# Patient Record
Sex: Male | Born: 1980 | Race: Black or African American | Hispanic: No | Marital: Single | Smoking: Never smoker
Health system: Southern US, Community
[De-identification: ages and names within clinical notes are randomized; demographics above are authoritative.]

---

## 2019-03-13 ENCOUNTER — Other Ambulatory Visit: Payer: Self-pay

## 2019-03-13 ENCOUNTER — Emergency Department (HOSPITAL_COMMUNITY)
Admission: EM | Admit: 2019-03-13 | Discharge: 2019-03-13 | Disposition: A | Discharge status: Home / Self Care | Payer: Self-pay | Attending: Emergency Medicine | Admitting: Emergency Medicine

## 2019-03-13 ENCOUNTER — Encounter (HOSPITAL_COMMUNITY): Payer: Self-pay

## 2019-03-13 DIAGNOSIS — K029 Dental caries, unspecified: Secondary | ICD-10-CM | POA: Insufficient documentation

## 2019-03-13 DIAGNOSIS — R220 Localized swelling, mass and lump, head: Secondary | ICD-10-CM

## 2019-03-13 MED ORDER — PENICILLIN V POTASSIUM 500 MG PO TABS: 500.0000 mg | ORAL_TABLET | Freq: Four times a day (QID) | ORAL | 0 refills | Status: AC

## 2019-03-13 MED ORDER — PENICILLIN V POTASSIUM 500 MG PO TABS
500.0000 mg | ORAL_TABLET | Freq: Once | ORAL | Status: AC
  Administered 2019-03-13: 500 mg via ORAL
  Filled 2019-03-13: qty 1

## 2019-03-13 NOTE — ED Notes (Signed)
Pt verbalizes understanding of DC instructions. Pt belongings returned and is ambulatory out of ED.  

## 2019-03-13 NOTE — ED Provider Notes (Signed)
[facility] DEPT Provider Note   CSN: [id number] Arrival date & time: 03/13/19  1205     History   Chief Complaint Chief Complaint  Patient presents with  . Abscess    HPI Erik Pierce is a 38 y.o. male without significant PMHx, presenting to the ED with complaint of gradual onset of right facial swelling since Friday. Pt reports this has occurred similarly in the past which he treated with warm salt water gargles and had drainage of an abscess in his mouth.  He denies fevers or chills.  He has been treating his symptoms with Aleve with moderate relief.  He does not have a dentist.     The history is provided by the patient.    History reviewed. No pertinent past medical history.  There are no active problems to display for this patient.   History reviewed. No pertinent surgical history.      Home Medications    Prior to Admission medications   Medication Sig Start Date End Date Taking? Authorizing Provider  penicillin v potassium (VEETID) 500 MG tablet Take 1 tablet (500 mg total) by mouth 4 (four) times daily for 7 days. 03/13/19 03/20/19  Carlene Bickley, [place] N, PA-C    Family History No family history on file.  Social History Social History   Tobacco Use  . Smoking status: Never Smoker  . Smokeless tobacco: Never Used  Substance Use Topics  . Alcohol use: Never    Frequency: Never  . Drug use: Never     Allergies   Patient has no known allergies.   Review of Systems Review of Systems  Constitutional: Negative for chills and fever.  HENT: Positive for dental problem and facial swelling.      Physical Exam Updated Vital Signs BP (!) 136/117   Pulse 80   Temp 97.7 F (36.5 C) (Oral)   Resp 18   SpO2 100%   Physical Exam Vitals signs and nursing note reviewed.  Constitutional:      General: He is not in acute distress.    Appearance: He is well-developed. He is not ill-appearing.  HENT:     Head:  Normocephalic and atraumatic.     Mouth/Throat:     Comments: Right medial cheek with swelling, no erythema or skin changes. Poor dentition throughout mouth. Gingival erythema present right upper mouth adjacent to the canine and 1st/2nd molars. No fluctuance over the gingiva or in the vestibule. Uvula is midline, no uvula swelling or trismus. Tolerating secretions. Eyes:     Conjunctiva/sclera: Conjunctivae normal.  Neck:     Musculoskeletal: Normal range of motion and neck supple.  Cardiovascular:     Rate and Rhythm: Normal rate.  Pulmonary:     Effort: Pulmonary effort is normal.  Lymphadenopathy:     Cervical: No cervical adenopathy.  Neurological:     Mental Status: He is alert.  Psychiatric:        Mood and Affect: Mood normal.        Behavior: Behavior normal.      ED Treatments / Results  Labs (all labs ordered are listed, but only abnormal results are displayed) Labs Reviewed - No data to display  EKG None  Radiology No results found.  Procedures Procedures (including critical care time)  Medications Ordered in ED Medications  penicillin v potassium (VEETID) tablet 500 mg (500 mg Oral Given 03/13/19 1640)     Initial Impression / Assessment and Plan / ED Course  I  have reviewed the triage vital signs and the nursing notes.  Pertinent labs & imaging results that were available during my care of the patient were reviewed by me and considered in my medical decision making (see chart for details).        Patient with facial swelling, likely due to dental caries.  No gross abscess.  VSS, afebrile, tolerating secretions. Exam unconcerning for peritonsillar abscess, or deep space infection.  Will treat with penicillin and pain medicine.  Urged patient to follow-up with dentist. Pt safe for discharge.  Discussed results, findings, treatment and follow up. Patient advised of return precautions. Patient verbalized understanding and agreed with plan.  Final  Clinical Impressions(s) / ED Diagnoses   Final diagnoses:  Pain due to dental caries  Facial swelling    ED Discharge Orders         Ordered    penicillin v potassium (VEETID) 500 MG tablet  4 times daily     03/13/19 1644           Cheryel Kyte, [place] N, [place] 03/13/19 1644    Little, Ambrose [place], MD 03/13/19 [phone]

## 2019-03-13 NOTE — Discharge Instructions (Addendum)
Please read instructions below. Take the antibiotic, Penicillin V, 4 times per day until they are gone. You can take Naproxen/aleve up to 2 times per day with meals, as needed for pain. Schedule an appointment with a dentist, using the dental resource guide attached. Return to the ER for difficulty swallowing or breathing, fever, or new or worsening symptoms.

## 2019-03-13 NOTE — ED Triage Notes (Signed)
Pt presents with c/o abscess on the upper right gum in his mouth. Face is significantly swollen. Pt reports hx of same.

## 2019-06-13 ENCOUNTER — Other Ambulatory Visit: Payer: Self-pay

## 2019-06-13 ENCOUNTER — Emergency Department (HOSPITAL_COMMUNITY)
Admission: EM | Admit: 2019-06-13 | Discharge: 2019-06-13 | Disposition: A | Discharge status: Home / Self Care | Payer: Self-pay | Attending: Emergency Medicine | Admitting: Emergency Medicine

## 2019-06-13 ENCOUNTER — Emergency Department (HOSPITAL_COMMUNITY): Payer: Self-pay

## 2019-06-13 ENCOUNTER — Encounter (HOSPITAL_COMMUNITY): Payer: Self-pay | Admitting: Emergency Medicine

## 2019-06-13 DIAGNOSIS — X500XXA Overexertion from strenuous movement or load, initial encounter: Secondary | ICD-10-CM | POA: Insufficient documentation

## 2019-06-13 DIAGNOSIS — Y9259 Other trade areas as the place of occurrence of the external cause: Secondary | ICD-10-CM | POA: Insufficient documentation

## 2019-06-13 DIAGNOSIS — M79645 Pain in left finger(s): Secondary | ICD-10-CM

## 2019-06-13 DIAGNOSIS — Y939 Activity, unspecified: Secondary | ICD-10-CM | POA: Insufficient documentation

## 2019-06-13 DIAGNOSIS — Y990 Civilian activity done for income or pay: Secondary | ICD-10-CM | POA: Insufficient documentation

## 2019-06-13 DIAGNOSIS — S6992XA Unspecified injury of left wrist, hand and finger(s), initial encounter: Secondary | ICD-10-CM

## 2019-06-13 NOTE — ED Provider Notes (Signed)
[facility] DEPT Provider Note   CSN: [id number] Arrival date & time: 06/13/19  1018     History Chief Complaint  Patient presents with  . Finger Injury    Erik Pierce is a 39 y.o. male.  HPI Patient presents today with left middle digit PIP joint pain.  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  He states that approximately 6 hours into his shift today when he began having an achy pain in his PIP.  He describes it as constant, achy, worse with movement and touch.  He states he continued to work and that he got much worse several hours later.  He states he stopped and felt so much better however he states that there has been some swelling and pain in the joint since that time.  He denies any trauma to the area.  Denies any bleeding, discharge, warmth to touch or tenderness over any other areas of the finger.  He states he has good sensation in the hand and finger and is able to move his fingers through full range of motion.   Patient denies any wrist pain, elbow pain, shoulder pain.     History reviewed. No pertinent past medical history.  There are no problems to display for this patient.   History reviewed. No pertinent surgical history.     No family history on file.  Social History   Tobacco Use  . Smoking status: Never Smoker  . Smokeless tobacco: Never Used  Substance Use Topics  . Alcohol use: Never  . Drug use: Never    Home Medications Prior to Admission medications   Not on File    Allergies    Patient has no known allergies.  Review of Systems   Review of Systems  Constitutional: Negative for chills and fever.  HENT: Negative for congestion.   Respiratory: Negative for shortness of breath.   Cardiovascular: Negative for chest pain.  Gastrointestinal: Negative for abdominal pain.  Musculoskeletal: Positive for joint swelling. Negative  for neck pain.       Left middle finger pain    Physical Exam Updated Vital Signs BP 126/78 (BP Location: Right Arm)   Pulse 75   Temp 97.6 F (36.4 C) (Oral)   Resp 16   Ht 5\' 7"  (1.702 m)   Wt 63.5 kg   SpO2 100%   BMI 21.93 kg/m   Physical Exam Vitals and nursing note reviewed.  Constitutional:      General: He is not in acute distress.    Appearance: Normal appearance. He is not ill-appearing.  HENT:     Head: Normocephalic and atraumatic.  Eyes:     General: No scleral icterus.       Right eye: No discharge.        Left eye: No discharge.     Conjunctiva/sclera: Conjunctivae normal.  Pulmonary:     Effort: Pulmonary effort is normal.     Breath sounds: No stridor.  Musculoskeletal:     Comments: Left middle digit PIP tenderness to palpation.  F ROM.  No redness or swelling.  Sensation intact distally.  Cap refill less than 2 seconds.  Able to flex and extend at MCP without difficulty as well.  Skin:    General: Skin is warm and dry.     Capillary Refill: Capillary refill takes less than 2 seconds.     Comments: No rash, redness,  warmth, mild swelling  Neurological:     Mental Status: He is alert and oriented to person, place, and time. Mental status is at baseline.     ED Results / Procedures / Treatments   Labs (all labs ordered are listed, but only abnormal results are displayed) Labs Reviewed - No data to display  EKG None  Radiology DG Finger Middle Left  Result Date: 06/13/2019 CLINICAL DATA:  Pain after heavy lifting EXAM: LEFT THIRD FINGER 2+V COMPARISON:  None. FINDINGS: Frontal, oblique, and lateral views were obtained. No fracture or dislocation. Joint spaces appear normal. No erosive change. IMPRESSION: No fracture or dislocation.  No evident arthropathy. Electronically Signed   By: Bretta Bang III M.D.   On: 06/13/2019 11:02    Procedures Procedures (including critical care time)  Medications Ordered in ED Medications - No data to  display  ED Course  I have reviewed the triage vital signs and the nursing notes.  Pertinent labs & imaging results that were available during my care of the patient were reviewed by me and considered in my medical decision making (see chart for details).  Left finger pain that began today.  Clinical Course as of Jun 12 1128  Mon Jun 13, 2019  1124 Finger  x-ray inability reviewed myself. No evidence of fracture or bony abnormality.  DG Finger Middle Left [WF]    Clinical Course User Index [WF] Gailen Shelter, [place]   MDM Rules/Calculators/A&P                      Suspect overuse injury of left middle digit.  No evidence of gout, doubt rheumatoid arthritis or osteoarthritis as it is single digit and is consistent with repetitive motion that he does on a daily basis for long hours.  He has no decrease in sensation or loss of flexion extension.  Doubt there is a tendon injury to the extensor or flexor tendon.  He has full range of motion no warmth to touch doubt septic arthritis.  Final Clinical Impression(s) / ED Diagnoses Final diagnoses:  Injury of finger of left hand, initial encounter  Pain of left middle finger    Rx / DC Orders ED Discharge Orders    None       Gailen Shelter, [place] 06/13/19 1131    Bethann Berkshire, MD 06/14/19 [phone]

## 2019-06-13 NOTE — ED Triage Notes (Signed)
Pt verbalizes pain to left middle finger; does repetative movement moving boxes. Pain onset Friday.

## 2019-06-13 NOTE — Discharge Instructions (Addendum)
Please rest ice and elevate your left middle finger.  Use ibuprofen 600 mg 3 times daily (every 6 hours) as needed for pain.

## 2020-08-01 IMAGING — CR DG FINGER MIDDLE 2+V*L*
3 series · 3 of 3 positions shown · non-contrast
Comparison: None.

CLINICAL DATA: Pain after heavy lifting

EXAM:
LEFT THIRD FINGER 2+V

[x finger pa left]
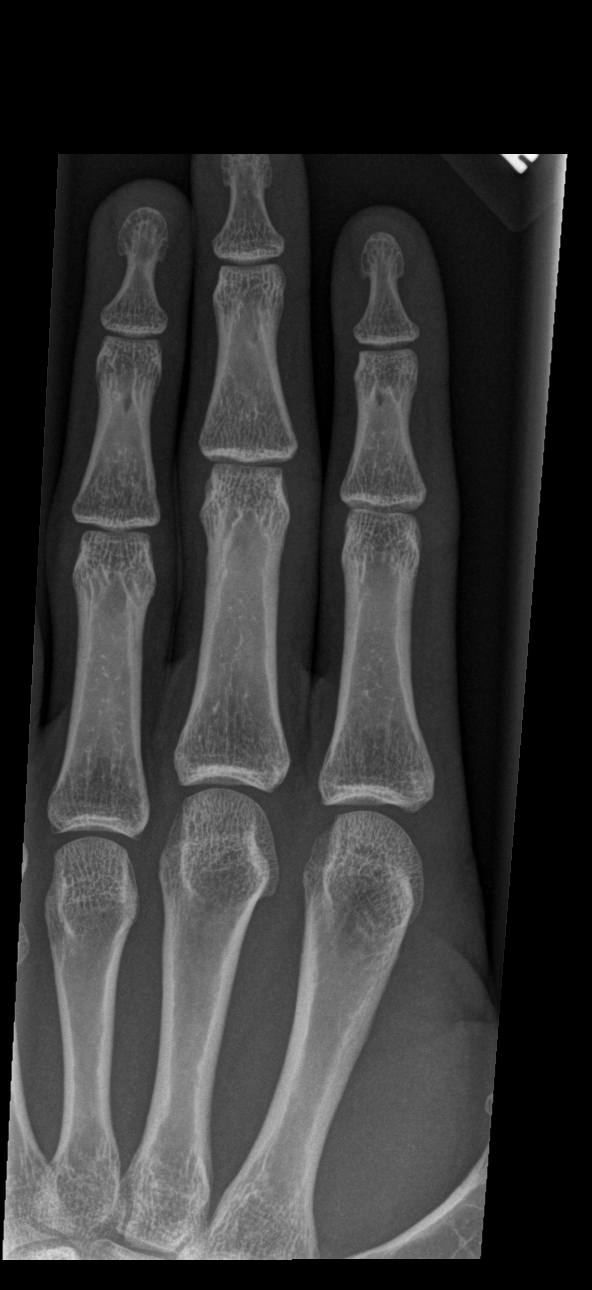

[x finger obl left]
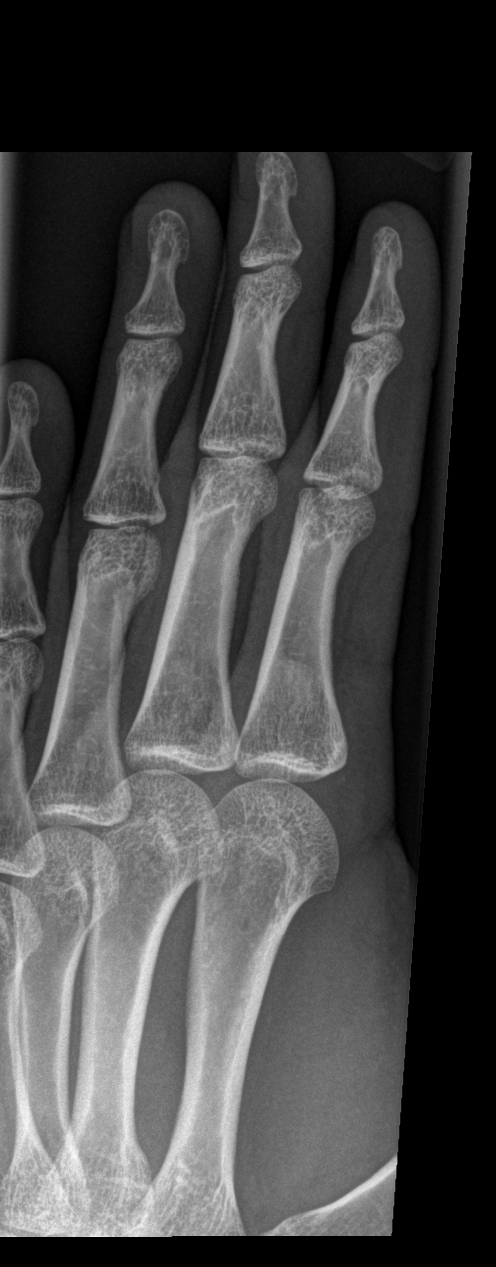

[x finger lat left]
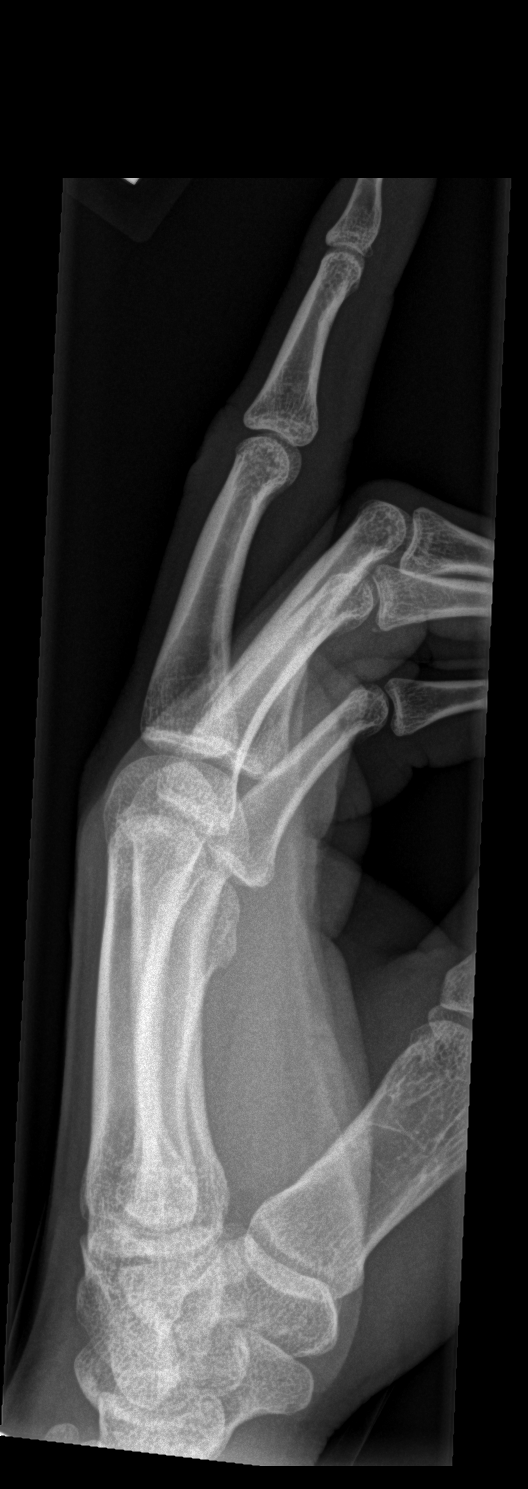

[3 of 3 positions shown; findings below may reference images not displayed]

FINDINGS: Frontal, oblique, and lateral views were obtained. No fracture or
dislocation. Joint spaces appear normal. No erosive change.
IMPRESSION: No fracture or dislocation.  No evident arthropathy.

## 2022-07-07 DEATH — deceased
